# Patient Record
Sex: Female | Born: 1940 | Race: White | Hispanic: No | Marital: Married
Health system: Southern US, Community
[De-identification: ages and names within clinical notes are randomized; demographics above are authoritative.]

## PROBLEM LIST (undated history)

## (undated) HISTORY — PX: AUGMENTATION MAMMAPLASTY: SUR837

---

## 2003-10-26 ENCOUNTER — Ambulatory Visit (HOSPITAL_COMMUNITY): Admission: RE | Admit: 2003-10-26 | Discharge: 2003-10-26 | Payer: Self-pay | Admitting: Family Medicine

## 2004-01-02 ENCOUNTER — Ambulatory Visit (HOSPITAL_COMMUNITY): Admission: RE | Admit: 2004-01-02 | Discharge: 2004-01-02 | Payer: Self-pay | Admitting: Family Medicine

## 2004-01-03 ENCOUNTER — Ambulatory Visit (HOSPITAL_COMMUNITY): Admission: RE | Admit: 2004-01-03 | Discharge: 2004-01-03 | Payer: Self-pay | Admitting: Family Medicine

## 2005-09-06 ENCOUNTER — Ambulatory Visit (HOSPITAL_COMMUNITY): Admission: RE | Admit: 2005-09-06 | Discharge: 2005-09-06 | Payer: Self-pay | Admitting: Family Medicine

## 2005-11-21 ENCOUNTER — Ambulatory Visit (HOSPITAL_COMMUNITY): Admission: RE | Admit: 2005-11-21 | Discharge: 2005-11-21 | Payer: Self-pay | Admitting: Family Medicine

## 2005-12-04 ENCOUNTER — Ambulatory Visit (HOSPITAL_COMMUNITY): Admission: RE | Admit: 2005-12-04 | Discharge: 2005-12-04 | Payer: Self-pay | Admitting: General Surgery

## 2006-07-04 ENCOUNTER — Encounter: Admission: RE | Admit: 2006-07-04 | Discharge: 2006-07-04 | Payer: Self-pay | Admitting: *Deleted

## 2006-07-10 ENCOUNTER — Ambulatory Visit (HOSPITAL_COMMUNITY): Admission: RE | Admit: 2006-07-10 | Discharge: 2006-07-10 | Payer: Self-pay | Admitting: *Deleted

## 2006-07-27 ENCOUNTER — Emergency Department (HOSPITAL_COMMUNITY): Admission: EM | Admit: 2006-07-27 | Discharge: 2006-07-27 | Payer: Self-pay | Admitting: Emergency Medicine

## 2007-03-02 ENCOUNTER — Ambulatory Visit (HOSPITAL_COMMUNITY): Admission: RE | Admit: 2007-03-02 | Discharge: 2007-03-02 | Payer: Self-pay | Admitting: Family Medicine

## 2007-03-11 ENCOUNTER — Ambulatory Visit (HOSPITAL_COMMUNITY): Admission: RE | Admit: 2007-03-11 | Discharge: 2007-03-11 | Payer: Self-pay | Admitting: Family Medicine

## 2007-04-03 ENCOUNTER — Ambulatory Visit: Payer: Self-pay | Admitting: Gastroenterology

## 2007-04-08 ENCOUNTER — Ambulatory Visit (HOSPITAL_COMMUNITY): Admission: RE | Admit: 2007-04-08 | Discharge: 2007-04-08 | Payer: Self-pay | Admitting: Internal Medicine

## 2010-04-01 ENCOUNTER — Encounter: Payer: Self-pay | Admitting: Family Medicine

## 2010-07-24 NOTE — Assessment & Plan Note (Signed)
Linda Paul, Linda Paul                 CHART#:  [id number]   DATE:  04/03/2007                       DOB:  [birth year]   REASON FOR CONSULTATION:  Change in bowel movements, diarrhea.   PHYSICIAN REQUESTING CONSULTATION:  Corrie Mckusick, M.D.   HISTORY OF PRESENT ILLNESS:  Linda Paul is a 70 year old Caucasian  female who presents today for further evaluation of above stated  symptoms.  She states she has had chronic constipation all of her life.  She remembers as a young child having very painful bowel movements and  rectal prolapse.  She states finally, around 2001, she did see a  physician who had recommended her having surgery.  She has had part of,  what sounds like, her sigmoid colon removed and reconstruction of her  rectum.  Since that time she has had no further prolapse.  She continues  to have chronic constipation.  She states about four months ago,  however, she started having diarrhea postprandially, having three and  four stools a day.  All of her stools were loose.  Never had a solid  stool.  Denies any black stools.  Questionable red blood in the stools  at times.  She said her last colonoscopy was about a year ago with Dr.  Lovell Sheehan and was normal.  She had diarrhea for four months, but the last  two weeks is back to having constipation.  She has about one stool a day  on average.  She has tried everything she can think of over-the-counter.  Currently she is on MiraLax.  She has tried probiotics, various  stimulant laxatives and fiber supplements.  She said the only thing that  seems to work is if she drinks quite a bit of Milk of Magnesia.  She  denies any heartburn.  Denies any dysphagia, odynophagia, nausea or  vomiting.  Really no abdominal pain.  At times she feels quite bloated.  She recently had some labs in December, which revealed a normal B12.  Folate level, iron and ferritin were normal.  CBC normal.  LFTsnormal.  TSH was not done.   CURRENT MEDICATIONS:  1. Imdur 30 mg daily.  2. Metoprolol 25 mg daily.  3. B12 sublingual 2500 mcg daily.  4. MiraLax 17 grams daily.   ALLERGIES:  Quinine.   PAST MEDICAL HISTORY:  1. Diverticulosis.  2. Constipation.  3. Mild spina bifida.  4. She has a chronic rash which comes in October of every year and      lasts to around April for the past four years.  Not diagnosed.  5. She had a cardiac catheterization in the past, which was negative.      Done for chest pain and dyspnea on exertion.  She states she was      told she has muscle spasms of the heart at times.  6. She has had two surgeries on her neck for herniated disks.  7. She has had surgeries for ovarian cysts.  8. She has had a complete hysterectomy.  9. Bilateral carpal tunnel release.  10.Bilateral foot surgery.  11.Surgery for a rectal prolapse as outlined above.   SOCIAL HISTORY:  She is married.  She has three children.  She moved to  [place] in 2005 after having lived in [place] most of her married  life.  She is retired from [employer] [place].  She is  a nonsmoker.  She consumes one glass of wine each night.   REVIEW OF SYSTEMS:  See HPI for GI.  Constitutional:  She states she has  gained about 25 pounds during the time she had diarrhea for four months.  Cardiopulmonary:  Dyspnea on exertion and chest pain with exertion as  outlined above.  Genitourinary:  No dysuria, hematuria.   PHYSICAL EXAMINATION:  Weight:  170.  Height:  5 feet 9 inches.  Temperature:  97.8.  Blood pressure:  98/62.  Pulse:  80.  GENERAL:  Pleasant, well-nourished, well-developed Caucasian female in  no acute distress.  SKIN:  Warm and dry.  No jaundice.  HEENT:  Sclerae nonicteric.  Oropharyngeal mucosa moist and pink.  No  lesions, erythema or exudate.  No lymphadenopathy or thyromegaly.  CHEST:  Lungs clear to auscultation.  CARDIAC:  Regular rate and rhythm.  Normal S1, S2.  No murmurs, rubs or  gallops.  ABDOMEN:   Positive bowel sounds. Abdomen soft.  She has significant  fullness in the left mid abdomen, questionable mass versus stool.  Tender in this area.  No rebound tenderness or guarding.  No  organomegaly.  No abdominal hernias or bruits.  LOWER EXTREMITIES:  No edema.  RECTAL:  No lesions externally.  No masses in the rectal vault.  Secretions are heme-negative.  Certainly no evidence of rectal prolapse.  Somewhat lax sphincter tone.   IMPRESSION:  Linda Paul is a 70 year old lady who presents for recent  four month history of diarrhea that seems to have resolved over the past  two weeks.  She is back to her baseline of chronic constipation.  She  gives a history of repair of rectal prolapse back in 2001.  On exam  there is a questionable left abdominal mass versus possible significant  amount of stool.  Given her recent significant change of bowel function  and abdominal exam, would pursue CT for further evaluation.   PLAN:  1. CT of the abdomen and pelvis with IV and oral contrast.  2. TSH.  3. Retrieve colonoscopy report.  4. Further recommendations to follow.  If CT is unremarkable, would      consider possibility of using Amitiza.   I would like to thank Dr. Assunta Found for allowing [place] to participate in  the care of this patient.       Tana Coast, P.A.  Electronically Signed     R. Roetta Sessions, M.D.  Electronically Signed    LL/MEDQ  D:  04/03/2007  T:  04/03/2007  Job:  [id number]   cc:   Corrie Mckusick, M.D.

## 2010-07-27 NOTE — Cardiovascular Report (Signed)
NAMELETICA, GIAIMO                ACCOUNT NO.:  [id number]   MEDICAL RECORD NO.:  [id number]          PATIENT TYPE:  OIB   LOCATION:  2858                         FACILITY:  [facility]   PHYSICIAN:  Darlin Priestly, MD  DATE OF BIRTH:  [birth year]   DATE OF PROCEDURE:  07/10/2006  DATE OF DISCHARGE:                            CARDIAC CATHETERIZATION   PROCEDURES:  1. Left heart catheterization.  2. Coronary angiogram.  3. Left ventriculogram.   __________   COMPLICATIONS:  None.   INDICATION FOR PROCEDURE:  The patient is a 70 year old female patient  Dr. Geanie Cooley with a history of hyperlipidemia and hypertension.  A  history of syncope and remote cardiac catheterization in [place].  She was recently seen in the office for crescendo angina.  She is now  brought for repeat catheterization for control of significant CAD.   PROCEDURE IN DETAIL:  After giving informed consent, the patient was  brought to the cardiac cath lab and the right and left groins were  shaved, prepped and draped in the usual sterile fashion.  __________  was established.  Using modified Seldinger technique, a 6-French  arterial sheath was inserted in the right femoral artery.  A 6-French  diagnostic catheter was performed for diagnostic angiography   The left main is a large, short vessel with no significant disease.   The LAD is a medium-size vessel which coursed the apex and gave rise to  two diagonal branches.  The LAD is noted to have mild 30% ostial  narrowing, but no high-grade stenosis.   The 1st and 2nd diagonals are small- to medium-size vessels with no  significant disease.   The left circumflex is a large vessel coursing the LAD, gives rise to  three obtuse marginal branches.  The AV circumflex has no significant  disease.   The 1st and 2nd OMs are medium-size vessels with no significant disease.   The 3rd OM is a small vessel with no significant disease.   The right coronary is a  large vessel with dominance of PDA as well as  posterolateral branch.  There is no significant disease in the RCA, PDA  or posterolateral branch.   The left ventriculogram reveals a preserved EF of 60%.   HEMODYNAMICS:  Systemic arterial pressure 107/40, LV systemic pressure  101/80, LVEDP of 10.   CONCLUSION:  1. No significant coronary artery disease.  2. Normal left ventricular systolic function.      Darlin Priestly, MD     RHM/MEDQ  D:  07/10/2006  T:  07/10/2006  Job:  [id number]   cc:   Corrie Mckusick, M.D. NAMECHASTY, RANDAL NO.:  [id number]   MEDICAL RECORD NO.:  [id number]          PATIENT TYPE:  OIB   LOCATION:  2858                         FACILITY:  [facility]   PHYSICIAN:  Molly Maduro  H. Jenne Campus, MD  DATE OF BIRTH:  [birth year]   DATE OF PROCEDURE:  07/10/2006  DATE OF DISCHARGE:                            CARDIAC CATHETERIZATION   PROCEDURES:  1. Left heart catheterization.  2. Coronary angiogram.  3. Left ventriculogram.   __________   COMPLICATIONS:  None.   INDICATION FOR PROCEDURE:  The patient is a 70 year old female patient  Dr. Geanie Cooley with a history of hyperlipidemia and hypertension.  A  history of syncope and remote cardiac catheterization in [place].  She was recently seen in the office for crescendo angina.  She is now  brought for repeat catheterization for control of significant CAD.   PROCEDURE IN DETAIL:  After giving informed consent, the patient was  brought to the cardiac cath lab and the right and left groins were  shaved, prepped and draped in the usual sterile fashion.  __________  was established.  Using modified Seldinger technique, a 6-French  arterial sheath was inserted in the right femoral artery.  A 6-French  diagnostic catheter was performed for diagnostic angiography   The left main is a large, short vessel with no significant disease.   The LAD is a medium-size vessel which coursed the apex and  gave rise to  two diagonal branches.  The LAD is noted to have mild 30% ostial  narrowing, but no high-grade stenosis.   The 1st and 2nd diagonals are small- to medium-size vessels with no  significant disease.   The left circumflex is a large vessel coursing the LAD, gives rise to  three obtuse marginal branches.  The AV circumflex has no significant  disease.   The 1st and 2nd OMs are medium-size vessels with no significant disease.   The 3rd OM is a small vessel with no significant disease.   The right coronary is a large vessel with dominance of PDA as well as  posterolateral branch.  There is no significant disease in the RCA, PDA  or posterolateral branch.   The left ventriculogram reveals a preserved EF of 60%.   HEMODYNAMICS:  Systemic arterial pressure 107/40, LV systemic pressure  101/80, LVEDP of 10.

## 2010-07-27 NOTE — H&P (Signed)
NAME:  Linda Paul, Linda Paul                ACCOUNT NO.:  [id number]   MEDICAL RECORD NO.:  [id number]          PATIENT TYPE:  AMB   LOCATION:  DAY                           FACILITY:  [facility]   PHYSICIAN:  Dalia Heading, M.D.  DATE OF BIRTH:  1941-01-04   DATE OF ADMISSION:  DATE OF DISCHARGE:  LH                                HISTORY & PHYSICAL   CHIEF COMPLAINT:  History of diverticulosis, need for screening colonoscopy.   HISTORY OF PRESENT ILLNESS:  The patient is a 70 year old white female who  is referred for endoscopic evaluation.  She needs a colonoscopy for  screening purposes.  She does have a history of diverticulosis and rectal  prolapse, for which she had both repaired with a partial colectomy in  [place] in 2001.  There is no family history of colon carcinoma.  No  current abdominal pain, weight loss, nausea, vomiting, diarrhea,  constipation, melena, hematochezia have been noted.   PAST MEDICAL HISTORY:  Includes hypertension.   PAST SURGICAL HISTORY:  As noted above, hysterectomy, cervical disk surgery,  carpal tunnel release.   CURRENT MEDICATIONS:  Venlafaxine, Zetia, calcium supplements, folic acid  supplements.   ALLERGIES:  QUININE.   REVIEW OF SYSTEMS:  Noncontributory.   PHYSICAL EXAMINATION:  GENERAL:  The patient is a well-developed, well-  nourished white female in no acute distress.  LUNGS:  Clear to auscultation with equal breath sounds bilaterally.  HEART:  Examination reveals a regular rate and rhythm without S3, S4,  murmurs.  ABDOMEN:  Soft, nontender, nondistended.  No hepatosplenomegaly or masses  are noted.  RECTAL:  Examination was deferred to the procedure.   IMPRESSION:  History of diverticulosis.   PLAN:  The patient is scheduled for a colonoscopy on December 04, 2005.  The risks and benefits of the procedure including bleeding and perforation  were fully explained to the patient, gave informed consent.      Dalia Heading, M.D.  Electronically Signed     MAJ/MEDQ  D:  11/28/2005  T:  11/29/2005  Job:  [id number]   cc:   Dalia Heading, M.D.  Fax: [phone]   Corrie Mckusick, M.D.  Fax: [phone]

## 2019-10-18 ENCOUNTER — Other Ambulatory Visit (HOSPITAL_BASED_OUTPATIENT_CLINIC_OR_DEPARTMENT_OTHER): Payer: Self-pay

## 2019-10-18 DIAGNOSIS — G4752 REM sleep behavior disorder: Secondary | ICD-10-CM

## 2019-10-31 ENCOUNTER — Other Ambulatory Visit: Payer: Self-pay

## 2019-10-31 ENCOUNTER — Ambulatory Visit: Payer: Medicare Other | Attending: Neurology | Admitting: Neurology

## 2019-10-31 DIAGNOSIS — G4761 Periodic limb movement disorder: Secondary | ICD-10-CM | POA: Diagnosis not present

## 2019-10-31 DIAGNOSIS — G479 Sleep disorder, unspecified: Secondary | ICD-10-CM | POA: Insufficient documentation

## 2019-10-31 DIAGNOSIS — G4752 REM sleep behavior disorder: Secondary | ICD-10-CM | POA: Diagnosis present

## 2019-11-03 NOTE — Procedures (Signed)
  [facility] Brenson Hartman A. Gerilyn Pilgrim, MD     [email or web address]             NOCTURNAL POLYSOMNOGRAPHY   LOCATION: [facility]  Patient Name: Linda, Paul Date: 10/31/2019 Gender: Female D.O.B: 06/28/1940 Age (years): 41 Referring Provider: Jorge Mandril NP Height (inches): 68 Interpreting Physician: Beryle Beams MD, ABSM Weight (lbs): 137 RPSGT: Alfonso Ellis BMI: 21 MRN: [id number] Neck Size: 13.50 CLINICAL INFORMATION Sleep Study Type: NPSG     Indication for sleep study: N/A     Epworth Sleepiness Score: 11     SLEEP STUDY TECHNIQUE As per the AASM Manual for the Scoring of Sleep and Associated Events v2.3 (April 2016) with a hypopnea requiring 4% desaturations.  The channels recorded and monitored were frontal, central and occipital EEG, electrooculogram (EOG), submentalis EMG (chin), nasal and oral airflow, thoracic and abdominal wall motion, anterior tibialis EMG, snore microphone, electrocardiogram, and pulse oximetry.  MEDICATIONS Medications self-administered by patient taken the night of the study : N/A No current outpatient medications on file.     SLEEP ARCHITECTURE The study was initiated at 10:08:14 PM and ended at 5:09:01 AM.  Sleep onset time was 47.7 minutes and the sleep efficiency was 66.2%%. The total sleep time was 278.6 minutes.  Stage REM latency was 142.0 minutes.  The patient spent 3.8%% of the night in stage N1 sleep, 55.5%% in stage N2 sleep, 8.3%% in stage N3 and 32.5% in REM.  Alpha intrusion was absent.  Supine sleep was 30.09%.  RESPIRATORY PARAMETERS The overall apnea/hypopnea index (AHI) was 3.4 per hour. There were 7 total apneas, including 5 obstructive, 2 central and 0 mixed apneas. There were 9 hypopneas and 14 RERAs.  The AHI during Stage REM sleep was 0.7 per hour.  AHI while supine was 10.7 per hour.  The mean oxygen saturation was 93.9%. The minimum SpO2 during sleep was 89.0%.  soft snoring was  noted during this study.  CARDIAC DATA The 2 lead EKG demonstrated sinus rhythm. The mean heart rate was 51.2 beats per minute. Other EKG findings include: PVCs.  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  IMPRESSIONS 1. 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 2. Abnormal sleep architecture is also noted primarily with reduced sleep efficiency. 3. No sleep apnea is noted in this study.   Argie Ramming, MD Diplomate, American Board of Sleep Medicine.  ELECTRONICALLY SIGNED ON:  11/03/2019, 9:04 AM [facility] PH: [phone]   FX: [phone] ACCREDITED BY THE AMERICAN ACADEMY OF SLEEP MEDICINE

## 2020-09-07 ENCOUNTER — Other Ambulatory Visit: Payer: Self-pay | Admitting: Internal Medicine

## 2020-09-07 DIAGNOSIS — Z139 Encounter for screening, unspecified: Secondary | ICD-10-CM

## 2020-09-08 ENCOUNTER — Other Ambulatory Visit: Payer: Self-pay

## 2020-09-08 ENCOUNTER — Ambulatory Visit
Admission: RE | Admit: 2020-09-08 | Discharge: 2020-09-08 | Disposition: A | Discharge status: Home / Self Care | Payer: Medicare Other | Source: Ambulatory Visit | Attending: Internal Medicine | Admitting: Internal Medicine

## 2020-09-08 ENCOUNTER — Other Ambulatory Visit: Payer: Self-pay | Admitting: Internal Medicine

## 2020-09-08 DIAGNOSIS — Z139 Encounter for screening, unspecified: Secondary | ICD-10-CM

## 2021-05-22 ENCOUNTER — Other Ambulatory Visit (HOSPITAL_COMMUNITY): Payer: Self-pay | Admitting: Neurology

## 2021-05-22 DIAGNOSIS — R413 Other amnesia: Secondary | ICD-10-CM

## 2021-06-14 ENCOUNTER — Ambulatory Visit (HOSPITAL_COMMUNITY)
Admission: RE | Admit: 2021-06-14 | Discharge: 2021-06-14 | Disposition: A | Discharge status: Home / Self Care | Payer: Medicare Other | Source: Ambulatory Visit | Attending: Neurology | Admitting: Neurology

## 2021-06-14 DIAGNOSIS — R413 Other amnesia: Secondary | ICD-10-CM | POA: Diagnosis present
